# Patient Record
Sex: Male | Born: 1960 | Race: White | Hispanic: No | Smoking: Current every day smoker
Health system: Southern US, Community
[De-identification: ages and names within clinical notes are randomized; demographics above are authoritative.]

## PROBLEM LIST (undated history)

## (undated) DIAGNOSIS — J449 Chronic obstructive pulmonary disease, unspecified: Secondary | ICD-10-CM

## (undated) DIAGNOSIS — F419 Anxiety disorder, unspecified: Secondary | ICD-10-CM

---

## 2011-10-13 ENCOUNTER — Emergency Department: Payer: Self-pay | Admitting: Emergency Medicine

## 2011-10-13 LAB — DRUG SCREEN, URINE
Amphetamines, Ur Screen: NEGATIVE (ref ?–1000)
Benzodiazepine, Ur Scrn: POSITIVE (ref ?–200)
Cannabinoid 50 Ng, Ur ~~LOC~~: NEGATIVE (ref ?–50)
MDMA (Ecstasy)Ur Screen: NEGATIVE (ref ?–500)
Methadone, Ur Screen: NEGATIVE (ref ?–300)
Phencyclidine (PCP) Ur S: NEGATIVE (ref ?–25)

## 2011-10-13 LAB — COMPREHENSIVE METABOLIC PANEL
Albumin: 3.5 g/dL (ref 3.4–5.0)
Anion Gap: 12 (ref 7–16)
Calcium, Total: 8.7 mg/dL (ref 8.5–10.1)
Chloride: 108 mmol/L — ABNORMAL HIGH (ref 98–107)
Creatinine: 1.06 mg/dL (ref 0.60–1.30)
EGFR (African American): >60
Glucose: 85 mg/dL (ref 65–99)
Osmolality: 284 (ref 275–301)
Potassium: 3.5 mmol/L (ref 3.5–5.1)
SGOT(AST): 30 U/L (ref 15–37)
SGPT (ALT): 17 U/L
Sodium: 143 mmol/L (ref 136–145)

## 2011-10-13 LAB — CBC
HGB: 13 g/dL (ref 13.0–18.0)
MCH: 31.6 pg (ref 26.0–34.0)
Platelet: 370 10*3/uL (ref 150–440)
RBC: 4.12 10*6/uL — ABNORMAL LOW (ref 4.40–5.90)
RDW: 14.3 % (ref 11.5–14.5)
WBC: 11.6 10*3/uL — ABNORMAL HIGH (ref 3.8–10.6)

## 2011-10-13 LAB — PRO B NATRIURETIC PEPTIDE: B-Type Natriuretic Peptide: 77 pg/mL (ref 0–125)

## 2011-10-13 LAB — TROPONIN I: Troponin-I: <0.02 ng/mL

## 2011-10-13 LAB — URINALYSIS, COMPLETE
Bilirubin,UR: NEGATIVE
Leukocyte Esterase: NEGATIVE
Nitrite: NEGATIVE
Ph: 5 (ref 4.5–8.0)
Protein: NEGATIVE
RBC,UR: <1 /HPF (ref 0–5)
Specific Gravity: 1.02 (ref 1.003–1.030)
Squamous Epithelial: NONE SEEN

## 2011-10-13 LAB — ETHANOL: Ethanol: <3 mg/dL

## 2011-10-13 LAB — ACETAMINOPHEN LEVEL
Acetaminophen: 41 ug/mL — ABNORMAL HIGH
Acetaminophen: 28 ug/mL

## 2011-10-13 LAB — TSH: Thyroid Stimulating Horm: 0.58 u[IU]/mL

## 2011-10-16 LAB — CULTURE, BLOOD (SINGLE)

## 2011-10-19 LAB — CULTURE, BLOOD (SINGLE)

## 2011-12-14 ENCOUNTER — Emergency Department: Payer: Self-pay | Admitting: Internal Medicine

## 2011-12-14 LAB — CBC
HGB: 15.3 g/dL (ref 13.0–18.0)
MCH: 32.7 pg (ref 26.0–34.0)
MCHC: 34.1 g/dL (ref 32.0–36.0)
MCV: 96 fL (ref 80–100)
RBC: 4.69 10*6/uL (ref 4.40–5.90)
WBC: 17.1 10*3/uL — ABNORMAL HIGH (ref 3.8–10.6)

## 2011-12-14 LAB — ACETAMINOPHEN LEVEL: Acetaminophen: <2 ug/mL

## 2011-12-14 LAB — COMPREHENSIVE METABOLIC PANEL
Albumin: 4 g/dL (ref 3.4–5.0)
Alkaline Phosphatase: 75 U/L (ref 50–136)
BUN: 16 mg/dL (ref 7–18)
Bilirubin,Total: 0.6 mg/dL (ref 0.2–1.0)
Calcium, Total: 8.9 mg/dL (ref 8.5–10.1)
Chloride: 103 mmol/L (ref 98–107)
EGFR (African American): >60
EGFR (Non-African Amer.): >60
Glucose: 129 mg/dL — ABNORMAL HIGH (ref 65–99)
SGPT (ALT): 43 U/L
Sodium: 141 mmol/L (ref 136–145)
Total Protein: 7.7 g/dL (ref 6.4–8.2)

## 2011-12-14 LAB — DIFFERENTIAL
Basophil #: 0 10*3/uL (ref 0.0–0.1)
Basophil %: 0.2 %
Eosinophil #: 0 10*3/uL (ref 0.0–0.7)
Eosinophil %: 0.2 %
Lymphocyte #: 2.4 10*3/uL (ref 1.0–3.6)
Monocyte %: 6.4 %
Neutrophil #: 13.5 10*3/uL — ABNORMAL HIGH (ref 1.4–6.5)
Neutrophil %: 78.9 %

## 2011-12-14 LAB — TROPONIN I: Troponin-I: 0.03 ng/mL

## 2011-12-14 LAB — ETHANOL: Ethanol %: <0.003 % (ref 0.000–0.080)

## 2011-12-14 LAB — SALICYLATE LEVEL: Salicylates, Serum: 1.7 mg/dL

## 2011-12-15 LAB — URINALYSIS, COMPLETE
Bilirubin,UR: NEGATIVE
Glucose,UR: NEGATIVE mg/dL (ref 0–75)
Nitrite: NEGATIVE
Specific Gravity: 1.03 (ref 1.003–1.030)
Squamous Epithelial: 3

## 2011-12-15 LAB — DRUG SCREEN, URINE
Amphetamines, Ur Screen: NEGATIVE (ref ?–1000)
Barbiturates, Ur Screen: POSITIVE (ref ?–200)
Benzodiazepine, Ur Scrn: NEGATIVE (ref ?–200)
Cannabinoid 50 Ng, Ur ~~LOC~~: NEGATIVE (ref ?–50)
MDMA (Ecstasy)Ur Screen: NEGATIVE (ref ?–500)
Opiate, Ur Screen: NEGATIVE (ref ?–300)
Phencyclidine (PCP) Ur S: NEGATIVE (ref ?–25)
Tricyclic, Ur Screen: NEGATIVE (ref ?–1000)

## 2011-12-15 LAB — ACETAMINOPHEN LEVEL: Acetaminophen: 3 ug/mL — ABNORMAL LOW

## 2014-12-24 NOTE — Consult Note (Signed)
PATIENT NAME:  Robert Long, Robert Long MR#:  [id number] DATE OF BIRTH:  1961-04-21  DATE OF CONSULTATION:  10/14/2011  REFERRING PHYSICIAN:  Suella Broad, MD  CONSULTING PHYSICIAN:  Jolanta B. Pucilowska, MD  REASON FOR CONSULTATION: To evaluate a patient after an accidental overdose.   IDENTIFYING DATA: Mr. Amorin is a 54 year old male with no past psychiatric history.   CHIEF COMPLAINT: "I want to go home".  HISTORY OF PRESENT ILLNESS: Mr. Deringer was released from prison after a lengthy sentence in May. He has not been able to find a job and has been increasingly depressed about it. He adamantly denies feeling suicidal. He is in touch with Dr. Lacie Scotts now who prescribes Fioricet, Tramadol, and most likely clonazepam. The patient also uses Xanax that he buys off the street but would not provide [place] with any details. He was having "fun with pills" on the day of admission and apparently took many of his medications. He also started superficially cutting his arm and face. His parents found him in the bathroom and were unable to awaken him. He was brought to the Emergency Room and briefly intubated. He is alert and oriented now, does not remember any details leading to his admission. Initially he even thought that someone else cut him. He again denies any thoughts of hurting himself. He endorses some symptoms of depression with decreased sleep, increased worries, anhedonia, social isolation, feeling of guilt. He does not report poor energy or problems with memory and concentration. Moreover, everyday he has been actively looking for jobs. 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 Unfortunately, he is not ready to address substance abuse problems and has every intention to continue on Valium, Xanax, and pain killers in the community. We learned that Dr. Lacie Scotts prescribes his medications from the family. The patient did not want to disclose any details. We  were able to talk to his wife. Apparently the patient lives in between his parents and his wife's house. The relationship is so-so. She is very supportive and does not believe that the patient has ever been suicidal in his life. She did notice that he has been depressed as he is unable to work.   PAST PSYCHIATRIC HISTORY: He reports no prior hospitalization. While in prison he was not treated for mood disorder or psychosis. He wants forced to complete a 90-day drug program in [facility]. This is for prisoners who are about to be released to teach them about substance abuse. Apparently he did have a history of substance use prior to his imprisonment. He denies other suicide attempts. He denies symptoms of depression, anxiety, or psychosis. He has never taken medications for mental illness.   FAMILY PSYCHIATRIC HISTORY: None reported.   PAST MEDICAL HISTORY: Undefined pain.   ALLERGIES: No known drug allergies.   MEDICATIONS ON ADMISSION: The patient takes Fioricet, tramadol, and unknown benzodiazepine. He was also prescribed Levaquin 700 mg 4 times daily.   SOCIAL HISTORY: He has been in prison. His sentence was 22 years. I think that he served less than that as there is a visit to the Emergency Room in September of 2002. He is married. His relationship with his wife is strained. He lives either with his parents or with his wife depending on the day. He has been attempting to find a job but as a convicted felon has a hard time finding employment even though he has been trained as a Nutritional therapist.  He says that he has been working as a Nutritional therapist since the age of 47. At this point he has no source of income and no access to care.   REVIEW OF SYSTEMS: CONSTITUTIONAL: No fevers or chills. No weight changes. EYES: No double or blurred vision. ENT: No hearing loss. RESPIRATORY: No shortness of breath or cough. CARDIOVASCULAR: No chest pain or orthopnea. GASTROINTESTINAL: No abdominal pain, nausea, vomiting, or  diarrhea. GU: No incontinence or frequency. ENDOCRINE: No heat or cold intolerance. LYMPHATIC: No anemia or easy bruising. INTEGUMENTARY: No acne or rash. MUSCULOSKELETAL: No muscle or joint pain. NEUROLOGIC: No tingling or weakness. PSYCHIATRIC: See history of present illness for details.   PHYSICAL EXAMINATION:   VITAL SIGNS: Blood pressure 105/56, pulse 95, respirations 18.   GENERAL: This is a well developed male covered in tattoos in no acute distress. Normal muscle strength in all extremities. No stiffness or cogwheeling. No tremor. The rest of the physical examination is deferred to his primary attending.   LABORATORY DATA: Chemistries are within normal limits. Blood alcohol level 0. LFTs within normal limits. TSH 0.58. Urine tox screen positive for barbiturates, benzodiazepines, and cocaine. CBC within normal limits except for white blood count of 11.6. Urinalysis is not suggestive of urinary tract infection. Serum acetaminophen 41, on repeat measured 28. Serum salicylates 3.   MENTAL STATUS EXAMINATION: The patient is alert and oriented to person, place, time, and somewhat to situation. He does not remember any details, is surprised to cut himself. There is no history of cutting. He is pleasant, polite, and cooperative. He is slightly tearful when talking about his difficulty finding a job. He is maintaining good eye contact. He is wearing hospital scrubs. His speech is soft. Mood is depressed with full affect. Thought processing is logical and goal oriented. Thought content he denies suicidal or homicidal ideation but was brought to the hospital after an accidental overdose on medications while having fun. There are no delusions or paranoia. There are no auditory or visual hallucinations. His cognition is grossly intact. He registers 3 out of 3 and recalls 3 out of 3 after five minutes. He can spell world forward and backward. He can name the current president. Of note, the patient has a facial  tic. Initially admission nurse told me that he grimaces his face and looks threatening. With me he was only grimacing the left side of his face and it looks like a tic. I do not think that he was doing it on purpose to frighten me. His insight is fair. His judgment is poor.   SUICIDE RISK ASSESSMENT: This is a patient with history of substance abuse, unwilling to stop, and worsening of his mood due to severe social stressors. He is not interested in taking medications but is open to psychotherapy. He is able to contract for safety, however, as long as substance abuse is a problem the patient is at increased risk for suicide.   DIAGNOSES:  AXIS I:  1. Adjustment disorder with depressed mood. 2. Benzodiazepine abuse.  3. Cocaine abuse.   AXIS II: Deferred.   AXIS III: None.   AXIS IV: Substance abuse, social stressors, employment, financial, housing, loss of way of life.   AXIS V: GAF 40.   PLAN:  1. The patient no longer meets criteria for involuntary inpatient psychiatric commitment. I will terminate proceedings. Please discharge as appropriate.  2. He is not interested in treatment of depression but agreed to see a therapist. He has an appointment at  [facility] for intake on February 19th. We hope that he will start working with Derinda LateSteve Succo for psychotherapy there. 3. Substance abuse. The patient declines substance abuse treatment.  4. He will stay with his parents and his wife.   ____________________________ Ellin GoodieJolanta B. Jennet MaduroPucilowska, MD jbp:drc D: 10/14/2011 13:59:48 ET T: 10/14/2011 14:33:12 ET JOB#: [id number]  cc: Jolanta B. Jennet MaduroPucilowska, MD, <Dictator> Shari ProwsJOLANTA B PUCILOWSKA MD ELECTRONICALLY SIGNED 10/17/2011 15:26

## 2014-12-24 NOTE — Consult Note (Signed)
Brief Consult Note: Diagnosis: Adjustment disorder with depressed mood, benzodiazepine dependence.   Patient was seen by consultant.   Consult note dictated.   Recommend further assessment or treatment.   Discussed with Attending MD.   Comments: [name] has no psychiatric history. He was released from prison after 20+ years in May. He is unable to find a job. He did find [name] who prescribes Tramadol and Fioricet for chronic pain and possibly benzodiazepines. The patient uses xanax off the streets. He has no intention to stop. Yesterday he was "having fun" with pills but apparently took too many. His parents found him asleep. He had to be briefly intubated in ER. He has superficial cuts on the face and forearm. He adamantly denies suicidal ideation, intention or a plan. He does not remember cutting and initially felt that someone else did it. He declines psychotropic medications but is open to psychotherapy.   We spoke with his wife who recognizes that he has been depressed lately due to unemployment. She does not believe that the patient is suicidal.   PLAN: 1. The patient no longer meets criteria for IVC. I will terminate proceedings. Please discharge as appropriate.  2. [facility]2011 with Robert Long. He will get therapy with Robert LateSteve Long, substance abuse counsellor.   3. He was [facility].   4. The patient was counselled on benzodiazepine abuse.  Electronic Signatures: Kristine LineaPucilowska, Jolanta (MD)  (Signed 12-Feb-13 11:55)  Authored: Brief Consult Note   Last Updated: 12-Feb-13 11:55 by Kristine LineaPucilowska, Jolanta (MD)

## 2014-12-24 NOTE — Consult Note (Signed)
Brief Consult Note: Diagnosis: Adjustment disorder with depressed mood, benzodiazepine dependence.   Patient was seen by consultant.   Consult note dictated.   Recommend further assessment or treatment.   Discussed with Attending MD.   Comments: [name] has no psychiatric history. He was released from prison after 20+ years in May. He is unable to find a job. He did find Dr. Lacie Long who prescribes Tramadol and Fioricet for chronic pain and Klonopin for anxiety. He has no intention to stop. Yesterday he was "having fun" with pills but apparently took too many. He developed abdominal pain and called EMS. He has superficial scratches on his face. He adamantly denies suicidal ideation, intention or a plan. He declines psychotropic medications. [name] but did not attend his appointment.    PLAN: 1. The patient no longer meets criteria for IVC. I will terminate proceedings. Please discharge as appropriate.  2. He will follow up with Dr. Audie Long of his choice.    3. The patient was counselled on benzodiazepine abuse.  Electronic Signatures: Kristine LineaPucilowska, Ramonia Mcclaran (MD)  (Signed 15-Apr-13 11:26)  Authored: Brief Consult Note   Last Updated: 15-Apr-13 11:26 by Kristine LineaPucilowska, Danuel Felicetti (MD)

## 2019-12-26 ENCOUNTER — Other Ambulatory Visit: Payer: Self-pay

## 2019-12-26 ENCOUNTER — Encounter: Payer: Self-pay | Admitting: Emergency Medicine

## 2019-12-26 ENCOUNTER — Emergency Department: Payer: Managed Care, Other (non HMO)

## 2019-12-26 ENCOUNTER — Emergency Department
Admission: EM | Admit: 2019-12-26 | Discharge: 2019-12-26 | Disposition: A | Discharge status: Home / Self Care | Payer: Managed Care, Other (non HMO) | Attending: Student in an Organized Health Care Education/Training Program | Admitting: Student in an Organized Health Care Education/Training Program

## 2019-12-26 DIAGNOSIS — R531 Weakness: Secondary | ICD-10-CM | POA: Insufficient documentation

## 2019-12-26 DIAGNOSIS — Z20822 Contact with and (suspected) exposure to covid-19: Secondary | ICD-10-CM | POA: Insufficient documentation

## 2019-12-26 DIAGNOSIS — R55 Syncope and collapse: Secondary | ICD-10-CM | POA: Diagnosis not present

## 2019-12-26 DIAGNOSIS — F17210 Nicotine dependence, cigarettes, uncomplicated: Secondary | ICD-10-CM | POA: Diagnosis not present

## 2019-12-26 DIAGNOSIS — J449 Chronic obstructive pulmonary disease, unspecified: Secondary | ICD-10-CM | POA: Insufficient documentation

## 2019-12-26 HISTORY — DX: Chronic obstructive pulmonary disease, unspecified: J44.9

## 2019-12-26 HISTORY — DX: Anxiety disorder, unspecified: F41.9

## 2019-12-26 LAB — CBC
HCT: 43.9 % (ref 39.0–52.0)
Hemoglobin: 15.3 g/dL (ref 13.0–17.0)
MCH: 31.7 pg (ref 26.0–34.0)
MCHC: 34.9 g/dL (ref 30.0–36.0)
MCV: 90.9 fL (ref 80.0–100.0)
Platelets: 433 10*3/uL — ABNORMAL HIGH (ref 150–400)
RBC: 4.83 MIL/uL (ref 4.22–5.81)
RDW: 14.3 % (ref 11.5–15.5)
WBC: 10.1 10*3/uL (ref 4.0–10.5)
nRBC: 0 % (ref 0.0–0.2)

## 2019-12-26 LAB — BASIC METABOLIC PANEL
Anion gap: 14 (ref 5–15)
BUN: 17 mg/dL (ref 6–20)
CO2: 23 mmol/L (ref 22–32)
Calcium: 10.1 mg/dL (ref 8.9–10.3)
Chloride: 98 mmol/L (ref 98–111)
Creatinine, Ser: 0.92 mg/dL (ref 0.61–1.24)
GFR calc Af Amer: >60 mL/min (ref >60)
GFR calc non Af Amer: >60 mL/min (ref >60)
Glucose, Bld: 107 mg/dL — ABNORMAL HIGH (ref 70–99)
Potassium: 4.7 mmol/L (ref 3.5–5.1)
Sodium: 135 mmol/L (ref 135–145)

## 2019-12-26 LAB — URINALYSIS, COMPLETE (UACMP) WITH MICROSCOPIC
Bacteria, UA: NONE SEEN
Bilirubin Urine: NEGATIVE
Glucose, UA: NEGATIVE mg/dL
Ketones, ur: NEGATIVE mg/dL
Leukocytes,Ua: NEGATIVE
Nitrite: NEGATIVE
Protein, ur: NEGATIVE mg/dL
Specific Gravity, Urine: 1.023 (ref 1.005–1.030)
Squamous Epithelial / HPF: NONE SEEN (ref 0–5)
pH: 6 (ref 5.0–8.0)

## 2019-12-26 LAB — TSH: TSH: 0.696 u[IU]/mL (ref 0.350–4.500)

## 2019-12-26 LAB — TROPONIN I (HIGH SENSITIVITY): Troponin I (High Sensitivity): 7 ng/L (ref <18)

## 2019-12-26 MED ORDER — AZITHROMYCIN 500 MG PO TABS: 500.0000 mg | ORAL_TABLET | Freq: Every day | ORAL | 0 refills | Status: AC

## 2019-12-26 MED ORDER — CLONAZEPAM 0.5 MG PO TABS
0.5000 mg | ORAL_TABLET | Freq: Once | ORAL | Status: AC
  Administered 2019-12-26: 0.5 mg via ORAL
  Filled 2019-12-26: qty 1

## 2019-12-26 MED ORDER — ALBUTEROL SULFATE HFA 108 (90 BASE) MCG/ACT IN AERS: 2.0000 | INHALATION_SPRAY | Freq: Four times a day (QID) | RESPIRATORY_TRACT | 0 refills | Status: AC | PRN

## 2019-12-26 NOTE — ED Triage Notes (Signed)
Patient presents to the ED with fatigue and weakness x 1 week and states today he had a near syncopal episode. Patient states, "any little task just wears me out."  Patient denies vomiting and diarrhea.  Patient reports a cough x 2 months since he started back smoking.

## 2019-12-26 NOTE — ED Provider Notes (Signed)
[facility] Emergency Department Provider Note    First MD Initiated Contact with Patient 12/26/19 1812     (approximate)  I have reviewed the triage vital signs and the nursing notes.   HISTORY  Chief Complaint Near Syncope and Weakness    HPI Robert Long is a 59 y.o. male below his past medical history presents to the ER for evaluation of generalized weakness.  States that he is starting to feel intermittent weakness.  Patient states that the symptoms have worsened since he was recently released from jail.  He has been in prison for 7 years.  States that since he got out he is having trouble adjusting to life and work.  Works daily job.  Denies any SI or HI.  States he does have moments of feeling very overwhelmed and feeling anxious and stressed.  Was previously on Klonopin prescriber Dr. Geralyn Corwin.  Has not been now established with primary doctor since being released due to work schedule.  He denies any chest pain.  Denies any shortness of breath at this time.  States he did start feeling worsening symptoms when he was in a crawl space who we was feeling feelings of anxiety.    Past Medical History:  Diagnosis Date  . Anxiety   . COPD (chronic obstructive pulmonary disease) (HCC)    No family history on file. History reviewed. No pertinent surgical history. There are no problems to display for this patient.     Prior to Admission medications   Not on File    Allergies Patient has no known allergies.    Social History Social History   Tobacco Use  . Smoking status: Current Every Day Smoker    Packs/day: 1.00    Types: Cigarettes  . Smokeless tobacco: Never Used  Substance Use Topics  . Alcohol use: Yes    Comment: "not much"  . Drug use: Not on file    Review of Systems Patient denies headaches, rhinorrhea, blurry vision, numbness, shortness of breath, chest pain, edema, cough, abdominal pain, nausea, vomiting, diarrhea,  dysuria, fevers, rashes or hallucinations unless otherwise stated above in HPI. ____________________________________________   PHYSICAL EXAM:  VITAL SIGNS: Vitals:   12/26/19 1446 12/26/19 1713  BP: 112/75 137/83  Pulse:  78  Resp:  15  Temp:    SpO2:  94%    Constitutional: Alert and oriented.  Eyes: Conjunctivae are normal.  Head: Atraumatic. Nose: No congestion/rhinnorhea. Mouth/Throat: Mucous membranes are moist.   Neck: No stridor. Painless ROM.  Cardiovascular: Normal rate, regular rhythm. Grossly normal heart sounds.  Good peripheral circulation. Respiratory: Normal respiratory effort.  No retractions. Lungs CTAB. Gastrointestinal: Soft and nontender. No distention. No abdominal bruits. No CVA tenderness. Genitourinary:  Musculoskeletal: No lower extremity tenderness nor edema.  No joint effusions. Neurologic:  Normal speech and language. No gross focal neurologic deficits are appreciated. No facial droop Skin:  Skin is warm, dry and intact. No rash noted. Psychiatric: Mood and affect are normal. Speech and behavior are normal.  ____________________________________________   LABS (all labs ordered are listed, but only abnormal results are displayed)  Results for orders placed or performed during the hospital encounter of 12/26/19 (from the past 24 hour(s))  Basic metabolic panel     Status: Abnormal   Collection Time: 12/26/19  2:57 PM  Result Value Ref Range   Sodium 135 135 - 145 mmol/L   Potassium 4.7 3.5 - 5.1 mmol/L   Chloride 98 98 - 111  mmol/L   CO2 23 22 - 32 mmol/L   Glucose, Bld 107 (H) 70 - 99 mg/dL   BUN 17 6 - 20 mg/dL   Creatinine, Ser 0.92 0.61 - 1.24 mg/dL   Calcium 10.1 8.9 - 10.3 mg/dL   GFR calc non Af Amer >60 >60 mL/min   GFR calc Af Amer >60 >60 mL/min   Anion gap 14 5 - 15  CBC     Status: Abnormal   Collection Time: 12/26/19  2:57 PM  Result Value Ref Range   WBC 10.1 4.0 - 10.5 K/uL   RBC 4.83 4.22 - 5.81 MIL/uL   Hemoglobin 15.3  13.0 - 17.0 g/dL   HCT 43.9 39.0 - 52.0 %   MCV 90.9 80.0 - 100.0 fL   MCH 31.7 26.0 - 34.0 pg   MCHC 34.9 30.0 - 36.0 g/dL   RDW 14.3 11.5 - 15.5 %   Platelets 433 (H) 150 - 400 K/uL   nRBC 0.0 0.0 - 0.2 %  Urinalysis, Complete w Microscopic     Status: Abnormal   Collection Time: 12/26/19  2:57 PM  Result Value Ref Range   Color, Urine YELLOW (A) YELLOW   APPearance HAZY (A) CLEAR   Specific Gravity, Urine 1.023 1.005 - 1.030   pH 6.0 5.0 - 8.0   Glucose, UA NEGATIVE NEGATIVE mg/dL   Hgb urine dipstick SMALL (A) NEGATIVE   Bilirubin Urine NEGATIVE NEGATIVE   Ketones, ur NEGATIVE NEGATIVE mg/dL   Protein, ur NEGATIVE NEGATIVE mg/dL   Nitrite NEGATIVE NEGATIVE   Leukocytes,Ua NEGATIVE NEGATIVE   RBC / HPF 6-10 0 - 5 RBC/hpf   WBC, UA 0-5 0 - 5 WBC/hpf   Bacteria, UA NONE SEEN NONE SEEN   Squamous Epithelial / LPF NONE SEEN 0 - 5   Mucus PRESENT    Hyaline Casts, UA PRESENT   Troponin I (High Sensitivity)     Status: None   Collection Time: 12/26/19  3:21 PM  Result Value Ref Range   Troponin I (High Sensitivity) 7 <18 ng/L   ____________________________________________  EKG My review and personal interpretation at Time: 14:52   Indication: weakness  Rate: normal  Rhythm: sinus Axis: normal Other: normal intervals, no stemi, nonspecific t wave abn ____________________________________________  RADIOLOGY  I personally reviewed all radiographic images ordered to evaluate for the above acute complaints and reviewed radiology reports and findings.  These findings were personally discussed with the patient.  Please see medical record for radiology report.  ____________________________________________   PROCEDURES  Procedure(s) performed:  Procedures    Critical Care performed: no ____________________________________________   INITIAL IMPRESSION / ASSESSMENT AND PLAN / ED COURSE  Pertinent labs & imaging results that were available during my care of the patient  were reviewed by me and considered in my medical decision making (see chart for details).   DDX: Adjustment disorder, anxiety, panic, claustrophobia, CHF, ACS, electrolyte abnormality, anemia  EDU ON is a 58 y.o. who presents to the ED with symptoms as described above.  Patient is anxious appearing but nontoxic.  He is protecting his airway.  Has good breath sounds throughout.  EKG is nonischemic.  Troponin negative.  Symptoms have been ongoing for several weeks.  Is not having acute chest pain at this time.  No evidence of CHF.  Is not consistent with dissection or PE.  No focal neuro deficits.  Abdominal exam is soft and benign.  I suspect he is having difficulty adjusting to life  having just recently been released from prolonged incarceration.  We discussed need for close outpatient follow-up.  He denies any SI or HI.  Is not meeting criteria for IVC.  Will be given outpatient resources.     The patient was evaluated in Emergency Department today for the symptoms described in the history of present illness. He/she was evaluated in the context of the global COVID-19 pandemic, which necessitated consideration that the patient might be at risk for infection with the SARS-CoV-2 virus that causes COVID-19. Institutional protocols and algorithms that pertain to the evaluation of patients at risk for COVID-19 are in a state of rapid change based on information released by regulatory bodies including the CDC and federal and state organizations. These policies and algorithms were followed during the patient's care in the ED.  As part of my medical decision making, I reviewed the following data within the electronic MEDICAL RECORD NUMBER Nursing notes reviewed and incorporated, Labs reviewed, notes from prior ED visits and [place] Controlled Substance Database   ____________________________________________   FINAL CLINICAL IMPRESSION(S) / ED DIAGNOSES  Final diagnoses:  Weakness      NEW MEDICATIONS  STARTED DURING THIS VISIT:  New Prescriptions   No medications on file     Note:  This document was prepared using Dragon voice recognition software and may include unintentional dictation errors.    Willy Eddy, MD 12/26/19 [phone]

## 2019-12-27 LAB — SARS CORONAVIRUS 2 (TAT 6-24 HRS): SARS Coronavirus 2: NEGATIVE

## 2020-05-02 DEATH — deceased

## 2022-02-16 IMAGING — DX DG CHEST 1V PORT
1 series · 1 of 1 positions shown · non-contrast
Comparison: Chest radiograph 10/13/2011

CLINICAL DATA: Patient presents to the ED with fatigue and weakness
x 1 week and states today he had a near syncopal episode. Cough for
2 months. Current smoker.

EXAM:
PORTABLE CHEST 1 VIEW

[chest ap]
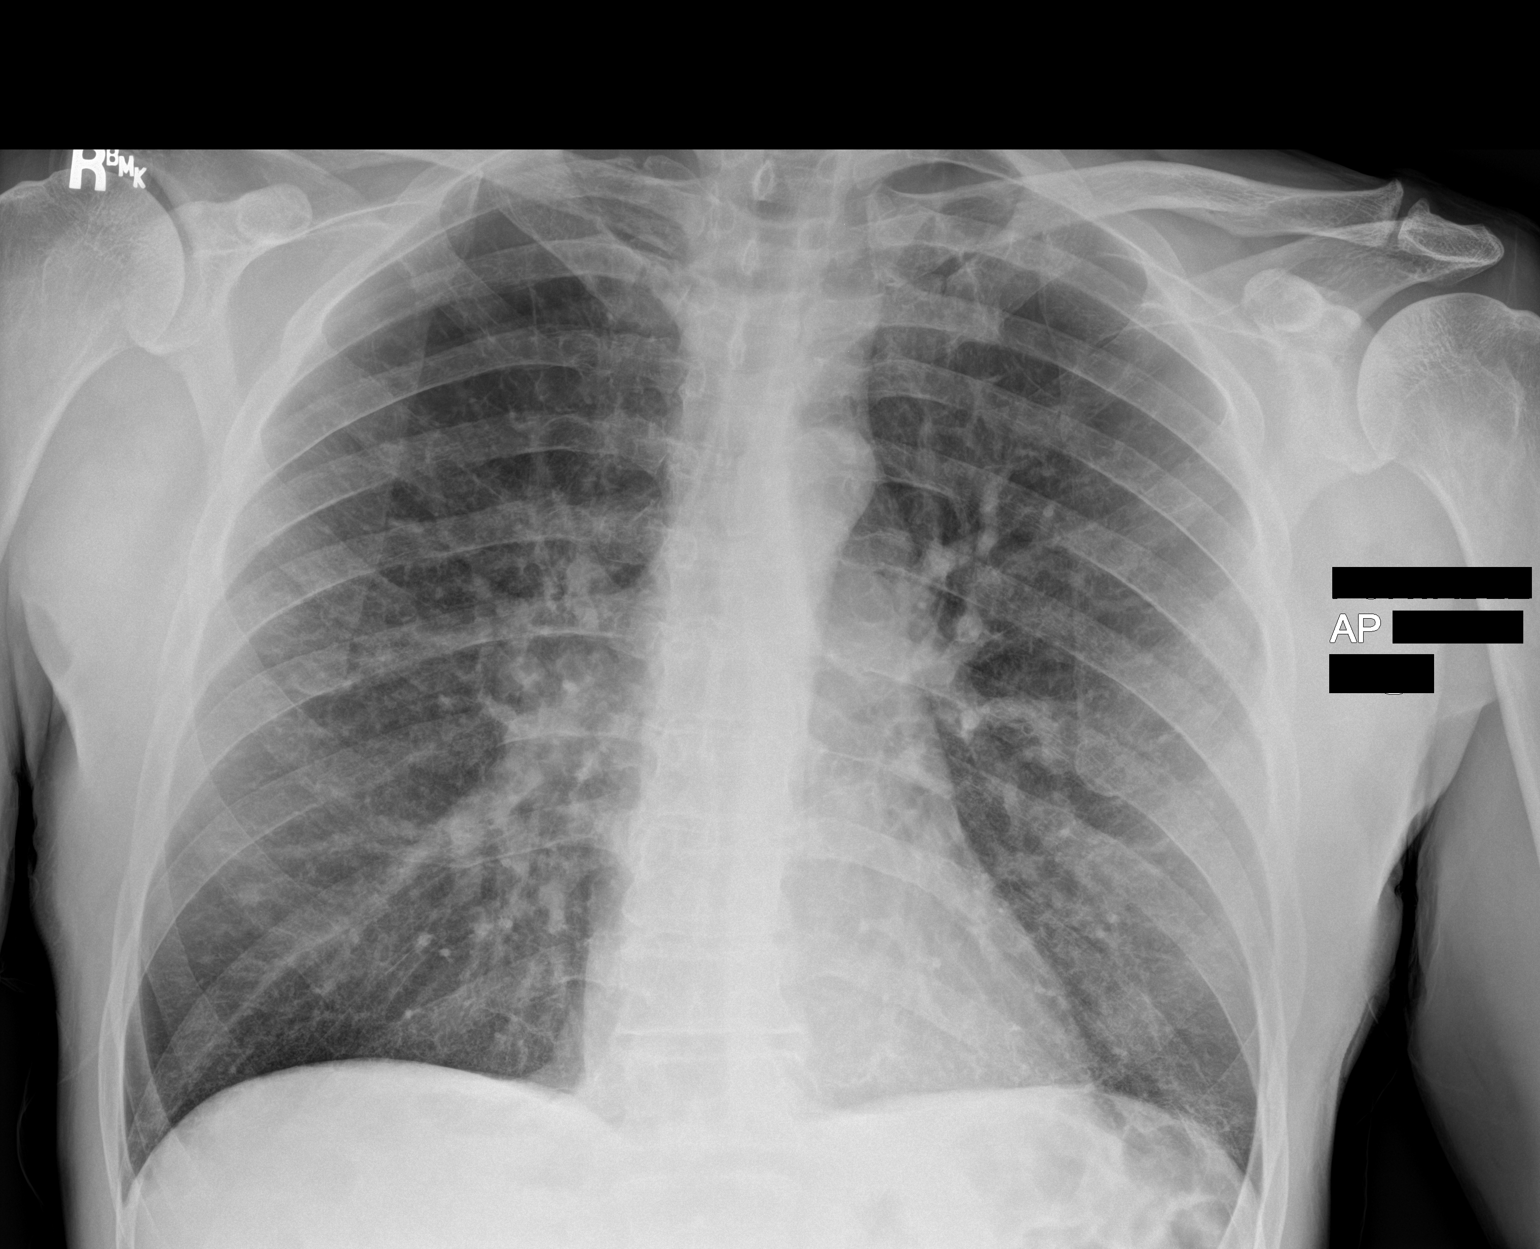

[1 of 1 positions shown; findings below may reference images not displayed]

FINDINGS: The heart size and mediastinal contours are within normal limits.
There are diffuse bilateral coarse interstitial markings. No focal
consolidation. No pneumothorax or significant pleural effusion. No
acute finding in the visualized skeleton.
IMPRESSION: Diffuse bilateral coarse interstitial markings could represent
chronic bronchitic changes, acute viral infection/pneumonitis not
excluded.
